# Patient Record
Sex: Female | Born: 1995 | Race: Black or African American | Hispanic: No | Marital: Single
Health system: Southern US, Community
[De-identification: ages and names within clinical notes are randomized; demographics above are authoritative.]

---

## 2018-03-05 ENCOUNTER — Emergency Department (HOSPITAL_COMMUNITY): Payer: BLUE CROSS/BLUE SHIELD

## 2018-03-05 ENCOUNTER — Emergency Department (HOSPITAL_COMMUNITY)
Admission: EM | Admit: 2018-03-05 | Discharge: 2018-03-05 | Disposition: A | Discharge status: Home / Self Care | Payer: BLUE CROSS/BLUE SHIELD | Attending: Emergency Medicine | Admitting: Emergency Medicine

## 2018-03-05 ENCOUNTER — Other Ambulatory Visit: Payer: Self-pay

## 2018-03-05 ENCOUNTER — Encounter (HOSPITAL_COMMUNITY): Payer: Self-pay

## 2018-03-05 DIAGNOSIS — Y999 Unspecified external cause status: Secondary | ICD-10-CM | POA: Insufficient documentation

## 2018-03-05 DIAGNOSIS — Y939 Activity, unspecified: Secondary | ICD-10-CM | POA: Insufficient documentation

## 2018-03-05 DIAGNOSIS — Y929 Unspecified place or not applicable: Secondary | ICD-10-CM | POA: Insufficient documentation

## 2018-03-05 DIAGNOSIS — S70311A Abrasion, right thigh, initial encounter: Secondary | ICD-10-CM | POA: Insufficient documentation

## 2018-03-05 DIAGNOSIS — R1030 Lower abdominal pain, unspecified: Secondary | ICD-10-CM | POA: Diagnosis present

## 2018-03-05 DIAGNOSIS — S301XXA Contusion of abdominal wall, initial encounter: Secondary | ICD-10-CM | POA: Diagnosis not present

## 2018-03-05 LAB — COMPREHENSIVE METABOLIC PANEL
ALK PHOS: 39 U/L (ref 38–126)
ALT: 14 U/L (ref 0–44)
AST: 19 U/L (ref 15–41)
Albumin: 3.3 g/dL — ABNORMAL LOW (ref 3.5–5.0)
Anion gap: 7 (ref 5–15)
BUN: 9 mg/dL (ref 6–20)
CALCIUM: 8.4 mg/dL — AB (ref 8.9–10.3)
CO2: 23 mmol/L (ref 22–32)
Chloride: 107 mmol/L (ref 98–111)
Creatinine, Ser: 0.84 mg/dL (ref 0.44–1.00)
GFR calc Af Amer: >60 mL/min (ref >60)
GFR calc non Af Amer: >60 mL/min (ref >60)
Glucose, Bld: 94 mg/dL (ref 70–99)
Potassium: 3.8 mmol/L (ref 3.5–5.1)
Sodium: 137 mmol/L (ref 135–145)
TOTAL PROTEIN: 6.3 g/dL — AB (ref 6.5–8.1)
Total Bilirubin: 0.6 mg/dL (ref 0.3–1.2)

## 2018-03-05 LAB — CBC WITH DIFFERENTIAL/PLATELET
Abs Immature Granulocytes: 0.02 10*3/uL (ref 0.00–0.07)
Basophils Absolute: 0.1 10*3/uL (ref 0.0–0.1)
Basophils Relative: 1 %
Eosinophils Absolute: 0.1 10*3/uL (ref 0.0–0.5)
Eosinophils Relative: 1 %
HCT: 37.6 % (ref 36.0–46.0)
Hemoglobin: 12 g/dL (ref 12.0–15.0)
Immature Granulocytes: 0 %
Lymphocytes Relative: 37 %
Lymphs Abs: 3.2 10*3/uL (ref 0.7–4.0)
MCH: 26.5 pg (ref 26.0–34.0)
MCHC: 31.9 g/dL (ref 30.0–36.0)
MCV: 83.2 fL (ref 80.0–100.0)
Monocytes Absolute: 0.6 10*3/uL (ref 0.1–1.0)
Monocytes Relative: 7 %
Neutro Abs: 4.8 10*3/uL (ref 1.7–7.7)
Neutrophils Relative %: 54 %
Platelets: 278 10*3/uL (ref 150–400)
RBC: 4.52 MIL/uL (ref 3.87–5.11)
RDW: 12.9 % (ref 11.5–15.5)
WBC: 8.8 10*3/uL (ref 4.0–10.5)
nRBC: 0 % (ref 0.0–0.2)

## 2018-03-05 LAB — I-STAT BETA HCG BLOOD, ED (MC, WL, AP ONLY): I-stat hCG, quantitative: <5 m[IU]/mL (ref <5)

## 2018-03-05 MED ORDER — MORPHINE SULFATE (PF) 4 MG/ML IV SOLN
4.0000 mg | Freq: Once | INTRAVENOUS | Status: AC
  Administered 2018-03-05: 4 mg via INTRAVENOUS
  Filled 2018-03-05: qty 1

## 2018-03-05 MED ORDER — IOHEXOL 300 MG/ML  SOLN
100.0000 mL | Freq: Once | INTRAMUSCULAR | Status: AC | PRN
  Administered 2018-03-05: 100 mL via INTRAVENOUS

## 2018-03-05 NOTE — Discharge Instructions (Addendum)
If you develop fever, vomiting, worsening abdominal pain or abdominal pain that does not improve, or any other new/concerning symptoms then return to the ER for evaluation.  You may take ibuprofen and Tylenol for your pain.  You may apply ice to the sore area.
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
## 2018-03-05 NOTE — ED Notes (Signed)
Pt able to stand a sit in wheelchair to go to the restroom.
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
## 2018-03-05 NOTE — ED Notes (Signed)
ED Provider at bedside. 
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
